# Patient Record
Sex: Female | Born: 1990 | Hispanic: Yes | Marital: Married | Smoking: Former smoker
Health system: Southern US, Community
[De-identification: ages and names within clinical notes are randomized; demographics above are authoritative.]

## PROBLEM LIST (undated history)

## (undated) DIAGNOSIS — Z8711 Personal history of peptic ulcer disease: Secondary | ICD-10-CM

## (undated) DIAGNOSIS — F319 Bipolar disorder, unspecified: Secondary | ICD-10-CM

## (undated) DIAGNOSIS — Z8719 Personal history of other diseases of the digestive system: Secondary | ICD-10-CM

## (undated) HISTORY — PX: OTHER SURGICAL HISTORY: SHX169

---

## 2018-01-22 ENCOUNTER — Emergency Department (HOSPITAL_COMMUNITY)
Admission: EM | Admit: 2018-01-22 | Discharge: 2018-01-22 | Disposition: A | Discharge status: Home / Self Care | Payer: BLUE CROSS/BLUE SHIELD | Attending: Emergency Medicine | Admitting: Emergency Medicine

## 2018-01-22 ENCOUNTER — Encounter (HOSPITAL_COMMUNITY): Payer: Self-pay | Admitting: Emergency Medicine

## 2018-01-22 ENCOUNTER — Other Ambulatory Visit: Payer: Self-pay

## 2018-01-22 ENCOUNTER — Emergency Department (HOSPITAL_COMMUNITY): Payer: BLUE CROSS/BLUE SHIELD

## 2018-01-22 DIAGNOSIS — Z87891 Personal history of nicotine dependence: Secondary | ICD-10-CM | POA: Insufficient documentation

## 2018-01-22 DIAGNOSIS — Z79899 Other long term (current) drug therapy: Secondary | ICD-10-CM | POA: Insufficient documentation

## 2018-01-22 DIAGNOSIS — R3129 Other microscopic hematuria: Secondary | ICD-10-CM | POA: Diagnosis not present

## 2018-01-22 DIAGNOSIS — R079 Chest pain, unspecified: Secondary | ICD-10-CM | POA: Diagnosis not present

## 2018-01-22 DIAGNOSIS — R1013 Epigastric pain: Secondary | ICD-10-CM | POA: Diagnosis not present

## 2018-01-22 HISTORY — DX: Personal history of other diseases of the digestive system: Z87.19

## 2018-01-22 HISTORY — DX: Personal history of peptic ulcer disease: Z87.11

## 2018-01-22 LAB — URINALYSIS, ROUTINE W REFLEX MICROSCOPIC
BILIRUBIN URINE: NEGATIVE
GLUCOSE, UA: NEGATIVE mg/dL
Ketones, ur: NEGATIVE mg/dL
Leukocytes, UA: NEGATIVE
NITRITE: NEGATIVE
PROTEIN: NEGATIVE mg/dL
Specific Gravity, Urine: 1.008 (ref 1.005–1.030)
pH: 7 (ref 5.0–8.0)

## 2018-01-22 LAB — CBC
HCT: 44.6 % (ref 36.0–46.0)
HEMOGLOBIN: 14.9 g/dL (ref 12.0–15.0)
MCH: 28.4 pg (ref 26.0–34.0)
MCHC: 33.4 g/dL (ref 30.0–36.0)
MCV: 85.1 fL (ref 78.0–100.0)
Platelets: 262 10*3/uL (ref 150–400)
RBC: 5.24 MIL/uL — ABNORMAL HIGH (ref 3.87–5.11)
RDW: 12 % (ref 11.5–15.5)
WBC: 8.8 10*3/uL (ref 4.0–10.5)

## 2018-01-22 LAB — COMPREHENSIVE METABOLIC PANEL
ALK PHOS: 87 U/L (ref 38–126)
ALT: 30 U/L (ref 0–44)
ANION GAP: 13 (ref 5–15)
AST: 30 U/L (ref 15–41)
Albumin: 4.2 g/dL (ref 3.5–5.0)
BILIRUBIN TOTAL: 0.8 mg/dL (ref 0.3–1.2)
BUN: 9 mg/dL (ref 6–20)
CALCIUM: 10 mg/dL (ref 8.9–10.3)
CO2: 26 mmol/L (ref 22–32)
Chloride: 102 mmol/L (ref 98–111)
Creatinine, Ser: 0.53 mg/dL (ref 0.44–1.00)
GFR calc Af Amer: >60 mL/min (ref >60)
GFR calc non Af Amer: >60 mL/min (ref >60)
Glucose, Bld: 101 mg/dL — ABNORMAL HIGH (ref 70–99)
Potassium: 3.9 mmol/L (ref 3.5–5.1)
SODIUM: 141 mmol/L (ref 135–145)
Total Protein: 7.6 g/dL (ref 6.5–8.1)

## 2018-01-22 LAB — I-STAT BETA HCG BLOOD, ED (MC, WL, AP ONLY): I-stat hCG, quantitative: <5 m[IU]/mL (ref <5)

## 2018-01-22 LAB — I-STAT TROPONIN, ED: Troponin i, poc: 0.02 ng/mL (ref 0.00–0.08)

## 2018-01-22 LAB — LIPASE, BLOOD: Lipase: 36 U/L (ref 11–51)

## 2018-01-22 MED ORDER — IPRATROPIUM-ALBUTEROL 0.5-2.5 (3) MG/3ML IN SOLN: 3.0000 mL | Freq: Once | RESPIRATORY_TRACT | Status: DC

## 2018-01-22 MED ORDER — DICYCLOMINE HCL 10 MG/ML IM SOLN
20.0000 mg | Freq: Once | INTRAMUSCULAR | Status: AC
  Administered 2018-01-22: 20 mg via INTRAMUSCULAR
  Filled 2018-01-22: qty 2

## 2018-01-22 MED ORDER — DICYCLOMINE HCL 20 MG PO TABS: 20.0000 mg | ORAL_TABLET | Freq: Two times a day (BID) | ORAL | 0 refills | Status: AC

## 2018-01-22 MED ORDER — MORPHINE SULFATE (PF) 4 MG/ML IV SOLN
4.0000 mg | Freq: Once | INTRAVENOUS | Status: AC
  Administered 2018-01-22: 4 mg via INTRAMUSCULAR
  Filled 2018-01-22: qty 1

## 2018-01-22 MED ORDER — SUCRALFATE 1 G PO TABS: 1.0000 g | ORAL_TABLET | Freq: Three times a day (TID) | ORAL | 0 refills | Status: AC

## 2018-01-22 MED ORDER — ONDANSETRON HCL 4 MG PO TABS
4.0000 mg | ORAL_TABLET | Freq: Once | ORAL | Status: AC
  Administered 2018-01-22: 4 mg via ORAL
  Filled 2018-01-22: qty 1

## 2018-01-22 MED ORDER — GI COCKTAIL ~~LOC~~
30.0000 mL | Freq: Once | ORAL | Status: AC
  Administered 2018-01-22: 30 mL via ORAL
  Filled 2018-01-22: qty 30

## 2018-01-22 MED ORDER — PREDNISONE 20 MG PO TABS: 60.0000 mg | ORAL_TABLET | Freq: Once | ORAL | Status: DC

## 2018-01-22 NOTE — ED Provider Notes (Signed)
[facility] EMERGENCY DEPARTMENT Provider Note   CSN: [id number] Arrival date & time: 01/22/18  0865     History   Chief Complaint Chief Complaint  Patient presents with  . Abdominal Pain  . Chest Pain    HPI Veronica Herrera is a 27 y.o. female.  HPI  Patient is a 27 year old female with a history of ADHD and abdominal surgical history significant for abdominoplasty presenting for upper abdominal pain chest pain.  Patient reports that she has had intermittent upper abdominal pain since 12-29-2017 when she was diagnosed with a peptic ulcer on CT scan at [facility].  Patient reports that she began having worsening pain, and her pain 2 AM.  Patient reports that it was a pressure-like sensation, and it is resolved at present.  Patient any shortness of breath, coughing, fever, chills. Patient reports she has had multiple episodes of emesis since going to bed around 11 pm last night but no hematemesis.  No melena or hematochezia.  Patient denies any urgency, frequency, or dysuria.  Patient denies any family history of early cardiac disease.  No estrogen use, immobilization, hospitalization, cancer treatment, recent surgeries, or extended travel, hemoptysis, or lower extremity edema or calf tenderness.  Patient has been taking outpatient Protonix, as well as intermittent Mylanta and viscous lidocaine per her PCP.  Patient denies regular alcohol or NSAID use.  Past Medical History:  Diagnosis Date  . History of stomach ulcers     There are no active problems to display for this patient.   Past Surgical History:  Procedure Laterality Date  . Tummy Tuck       OB History   None      Home Medications    Prior to Admission medications   Medication Sig Start Date End Date Taking? Authorizing Provider  alum & mag hydroxide-simeth (MAALOX/MYLANTA) 200-200-20 MG/5ML suspension Take 15 mLs by mouth every 3 (three) hours as needed for indigestion or heartburn.   Yes  [provider]  amphetamine-dextroamphetamine (ADDERALL) 20 MG tablet Take 20 mg by mouth 2 (two) times daily. 01/06/18  Yes [provider]  lidocaine (XYLOCAINE) 2 % solution Use as directed 15 mLs in the mouth or throat every 3 (three) hours as needed for mouth pain.  01/01/18  Yes [provider]  pantoprazole (PROTONIX) 40 MG tablet Take 40 mg by mouth daily. 12/30/17  Yes [provider]  ranitidine (ZANTAC) 150 MG tablet Take 150 mg by mouth daily. 12/30/17  Yes [provider]    Family History No family history on file.  Social History Social History   Tobacco Use  . Smoking status: Former Games developer  . Smokeless tobacco: Never Used  Substance Use Topics  . Alcohol use: Yes  . Drug use: Never     Allergies   Turmeric   Review of Systems Review of Systems  Constitutional: Negative for chills and fever.  HENT: Negative for congestion, sore throat, trouble swallowing and voice change.   Eyes: Negative for visual disturbance.  Respiratory: Positive for chest tightness. Negative for cough and shortness of breath.   Cardiovascular: Negative for chest pain, palpitations and leg swelling.  Gastrointestinal: Positive for abdominal pain, nausea and vomiting. Negative for blood in stool and diarrhea.  Genitourinary: Negative for dysuria and flank pain.  Musculoskeletal: Negative for back pain and myalgias.  Skin: Negative for rash.  Neurological: Negative for dizziness, syncope, light-headedness and headaches.     Physical Exam Updated Vital Signs BP 112/76  Pulse 86   Resp 12   Ht 5\' 2"  (1.575 m)   Wt 75.8 kg   SpO2 99%   BMI 30.54 kg/m   Physical Exam  Constitutional: She appears well-developed and well-nourished. No distress.  HENT:  Head: Normocephalic and atraumatic.  Mouth/Throat: Oropharynx is clear and moist.  Eyes: Pupils are equal, round, and reactive to light. Conjunctivae and EOM are normal.  Neck: Normal range  of motion. Neck supple.  Cardiovascular: Normal rate, regular rhythm, S1 normal and S2 normal.  No murmur heard. Pulmonary/Chest: Effort normal and breath sounds normal. She has no wheezes. She has no rales.  Abdominal: Soft. Bowel sounds are normal. She exhibits no distension. There is no guarding.  Mild tenderness to palpation of the periumbilical to epigastric region.  No guarding or rebound.  Abdomen not peritonitic.  Negative Murphy sign.  Musculoskeletal: Normal range of motion. She exhibits no edema or deformity.  Lymphadenopathy:    She has no cervical adenopathy.  Neurological: She is alert.  Cranial nerves grossly intact. Patient moves extremities symmetrically and with good coordination.  Skin: Skin is warm and dry. No rash noted. No erythema.  Psychiatric: She has a normal mood and affect. Her behavior is normal. Judgment and thought content normal.  Nursing note and vitals reviewed.    ED Treatments / Results  Labs (all labs ordered are listed, but only abnormal results are displayed) Labs Reviewed  COMPREHENSIVE METABOLIC PANEL - Abnormal; Notable for the following components:      Result Value   Glucose, Bld 101 (*)    All other components within normal limits  CBC - Abnormal; Notable for the following components:   RBC 5.24 (*)    All other components within normal limits  URINALYSIS, ROUTINE W REFLEX MICROSCOPIC - Abnormal; Notable for the following components:   Color, Urine STRAW (*)    Hgb urine dipstick SMALL (*)    Bacteria, UA RARE (*)    All other components within normal limits  LIPASE, BLOOD  I-STAT BETA HCG BLOOD, ED ([facility], [facility], [facility] ONLY)  I-STAT TROPONIN, ED    EKG EKG Interpretation  Date/Time:  Thursday January 22 2018 04:43:39 EDT Ventricular Rate:  96 PR Interval:  138 QRS Duration: 66 QT Interval:  334 QTC Calculation: 421 R Axis:   48 Text Interpretation:  Sinus rhythm with sinus arrhythmia with occasional Premature ventricular complexes  Otherwise normal ECG No old tracing to compare Confirmed by Dione BoozeGlick, David ([id number]) on 01/22/2018 7:02:48 AM   Radiology Dg Chest 2 View  Result Date: 01/22/2018 CLINICAL DATA:  Acute onset of generalized abdominal pain and chest pressure. EXAM: CHEST - 2 VIEW COMPARISON:  None. FINDINGS: The lungs are well-aerated and clear. There is no evidence of focal opacification, pleural effusion or pneumothorax. The heart is normal in size; the mediastinal contour is within normal limits. No acute osseous abnormalities are seen. IMPRESSION: No acute cardiopulmonary process seen. Electronically Signed   By: Roanna RaiderJeffery  Chang M.D.   On: 01/22/2018 05:49    Procedures Procedures (including critical care time)  Medications Ordered in ED Medications - No data to display   Initial Impression / Assessment and Plan / ED Course  I have reviewed the triage vital signs and the nursing notes.  Pertinent labs & imaging results that were available during my care of the patient were reviewed by me and considered in my medical decision making (see chart for details).  Clinical Course as of Jan 23 852  Thu Jan 22, 2018  0629 Spoke with [facility] radiology secretary who pulled up the image from 12-29-2017 in PACS of pt's CT abdomen/pelvis which stated in the impression " extraluminal fluid in the abdomen abdomen surrounding the distal stomach and duodenal bulb concerning for underlying peptic ulcer". C/w patient's symptoms at this time.    [AM]  0702 Hgb urine dipstick(!): SMALL [AM]  0739 Reassessed patient.  Patient reports minimal improvement in her symptoms. Repeat abdominal exam continues to be soft and nonperitonitic.   [AM]  0852 Reassessed.  No pain at present.   [AM]    Clinical Course User Index [AM] Elisha Ponder, PA-C    Patient is nontoxic-appearing,, and with benign abdomen.  Do not suspect perforated viscus.  Differential diagnosis includes peptic ulcer disease versus gastritis, cholecystitis, ACS,  PE, pneumonia, pleurisy, pneumothorax.  Doubt ACS, due to age, lack of major risk factors, and atypical story, in addition to lack of chest pain at present.  Nonischemic EKG and initial troponin negative. Do not feel delta troponin is warranted. No tenderness under costal margin suggestive of cholecystitis.  Chest x-ray lung exam are clear.  Most suspicious of persistent ulcer/gastritis at this time given patient's history and verified findings on CT scan.  Patient has no peritonitis on repeat abdominal exams that would warrant CT scan.  Work-up is unremarkable for new acute pathology with negative troponin, normal and nonischemic EKG, reviewed by me, normal chest x-ray, reviewed by me, no leukocytosis, and normal kidney, liver function, and electrolytes.  I did discuss with the patient that she has small amount of blood in her urine, and this needs to be reassessed by her primary doctor.  Return precautions were discussed including worsening abdominal pain or intractable nausea or vomiting.  Patient to follow-up with both primary care, as well as previously scheduled GI appointment.  Patient is in understanding and agrees with the plan of care.  Final Clinical Impressions(s) / ED Diagnoses   Final diagnoses:  Epigastric pain  Microscopic hematuria    ED Discharge Orders         Ordered    sucralfate (CARAFATE) 1 g tablet  3 times daily with meals & bedtime     01/22/18 0855    dicyclomine (BENTYL) 20 MG tablet  2 times daily     01/22/18 0855           Elisha Ponder, PA-C 01/22/18 1610    Dione Booze, MD 01/22/18 2238

## 2018-01-22 NOTE — Discharge Instructions (Addendum)
Please see the information and instructions below regarding your visit.  Your diagnoses today include:  1. Epigastric pain   2. Microscopic hematuria     Tests performed today include: See side panel of your discharge paperwork for testing performed today. Vital signs are listed at the bottom of these instructions.   Your blood work is very reassuring today.  In addition, your chest x-ray and EKG look normal.  Medications prescribed:    Take any prescribed medications only as prescribed, and any over the counter medications only as directed on the packaging.  Please start taking a medicine called Carafate or sucralfate.  This can be taken up to 4 times a day, with meals and before bedtime.  Please do not take your proton pump inhibitor (Protonix, Nexium, Prilosec) within 30 minutes of taking Carafate.  This medication coats the lining of the stomach to protect it from irritation and ulcers.  Please start taking Bentyl.  This is a medication that helps with spasming of the GI tract.  Home care instructions:  Please follow any educational materials contained in this packet.  Please review the alterations in food choices recommended.  Follow-up instructions: Please follow-up with your primary care provider in 5-7 days for further evaluation of your symptoms if they are not completely improved.  Please discuss with primary doctor whether they may be able to do stool antigen testing for H. pylori.  You also have a small amount of blood in your urine.  Please have a repeat urinalysis within 1 month to ensure this clears.  Please follow up with your GI doctor as soon as possible.  Return instructions:  Please return to the Emergency Department if you experience worsening symptoms. Return the emergency department immediately for any worsening belly pain, nausea or vomiting that prevents you keeping anything down. Please return if you have any other emergent concerns.  Additional  Information:   Your vital signs today were: BP 106/90    Pulse 87    Temp 98.3 F (36.8 C) (Oral)    Resp 15    Ht 5\' 2"  (1.575 m)    Wt 75.8 kg    SpO2 99%    BMI 30.54 kg/m  If your blood pressure (BP) was elevated on multiple readings during this visit above 130 for the top number or above 80 for the bottom number, please have this repeated by your primary care provider within one month. --------------  Thank you for allowing us to participate in your care today.

## 2018-01-22 NOTE — ED Notes (Signed)
Pt discharged from ED; instructions provided; Pt encouraged to return to ED if symptoms worsen and to f/u with PCP; Pt verbalized understanding of all instructions 

## 2018-01-22 NOTE — ED Triage Notes (Signed)
Pt reports being diagnosed with a stomach ulcer on 12/29/17. Pt reports that her adb started to hurt at 2300 hrs last night and then her chest started to hurt at 0230 this date. Pt reports that her chest pain was pressure in nature and the abd pain is a constant pain.

## 2020-02-03 IMAGING — CR DG CHEST 2V
2 series · 2 of 2 positions shown · non-contrast
Comparison: None.

CLINICAL DATA: Acute onset of generalized abdominal pain and chest
pressure.

EXAM:
CHEST - 2 VIEW

[chest pa]
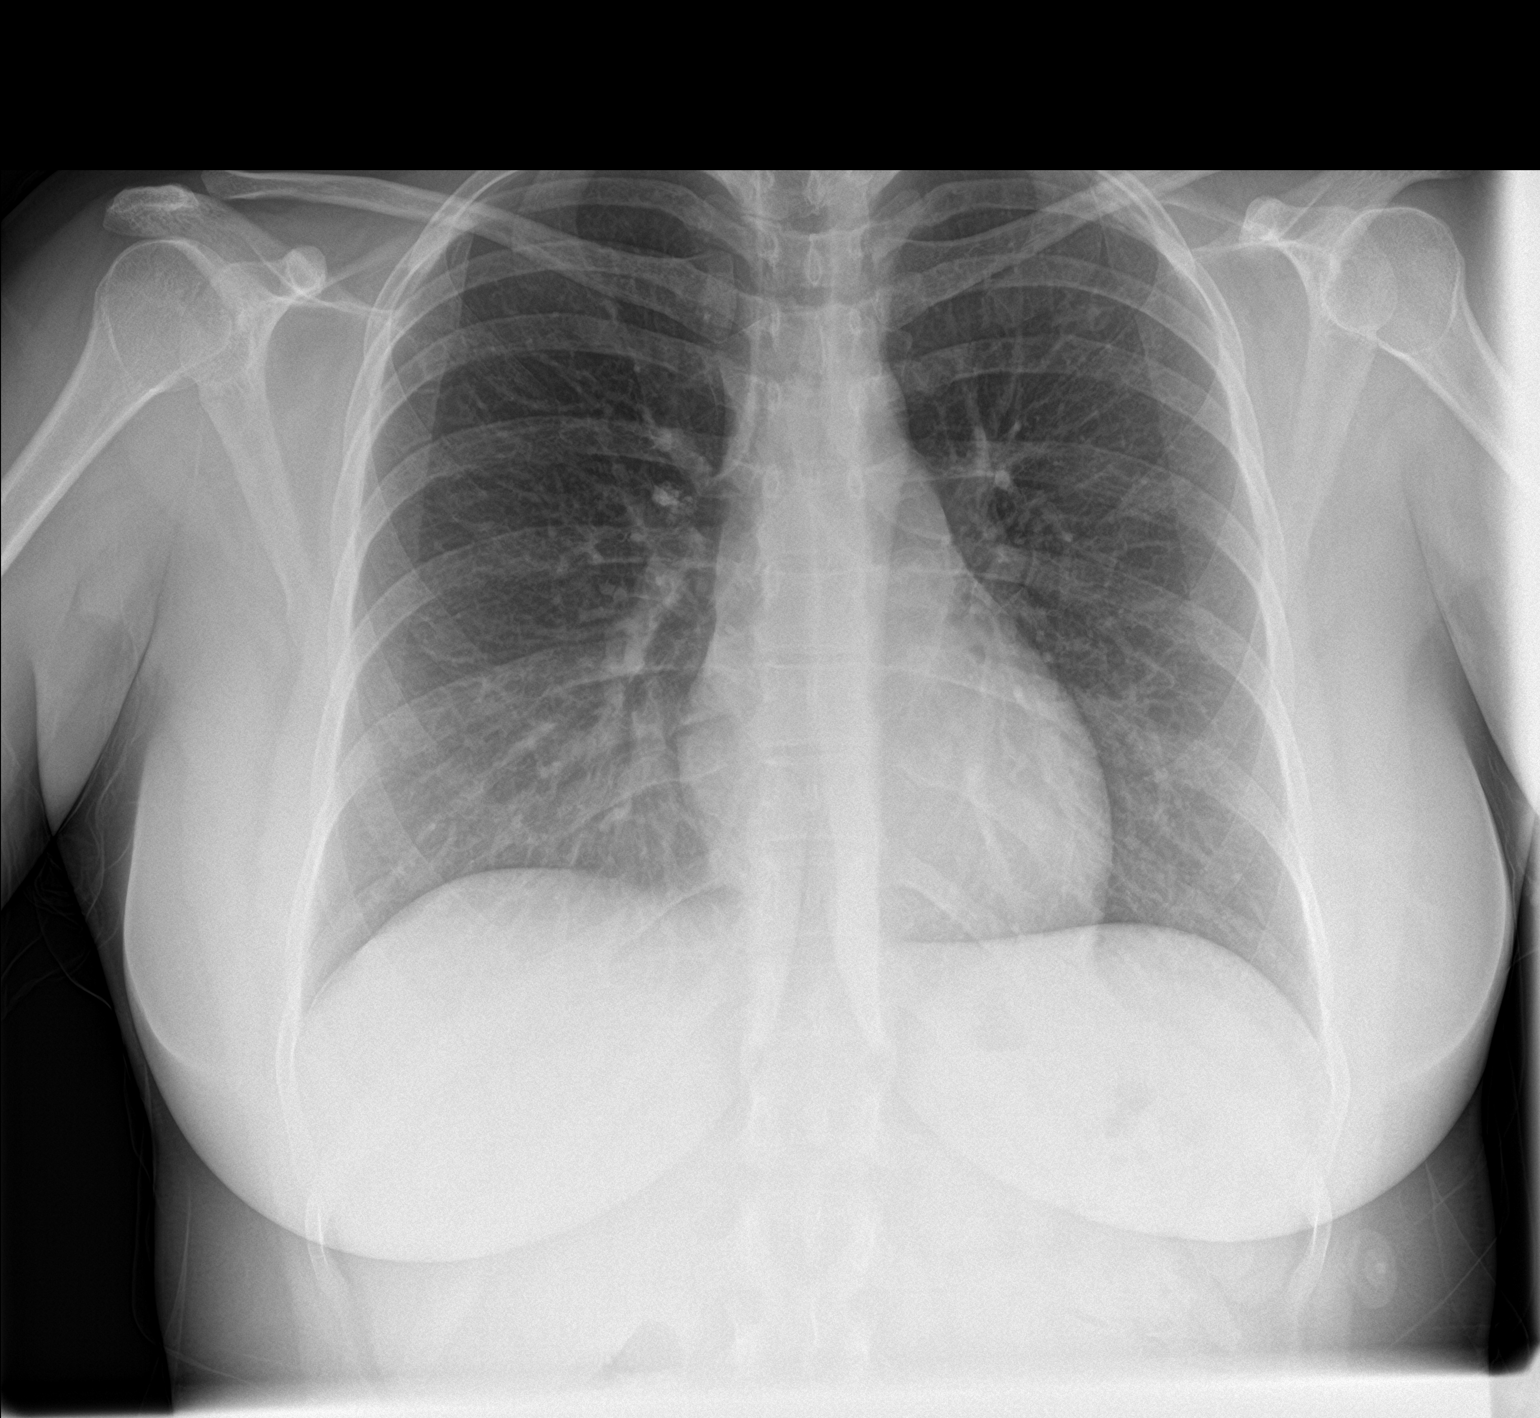

[chest lat]
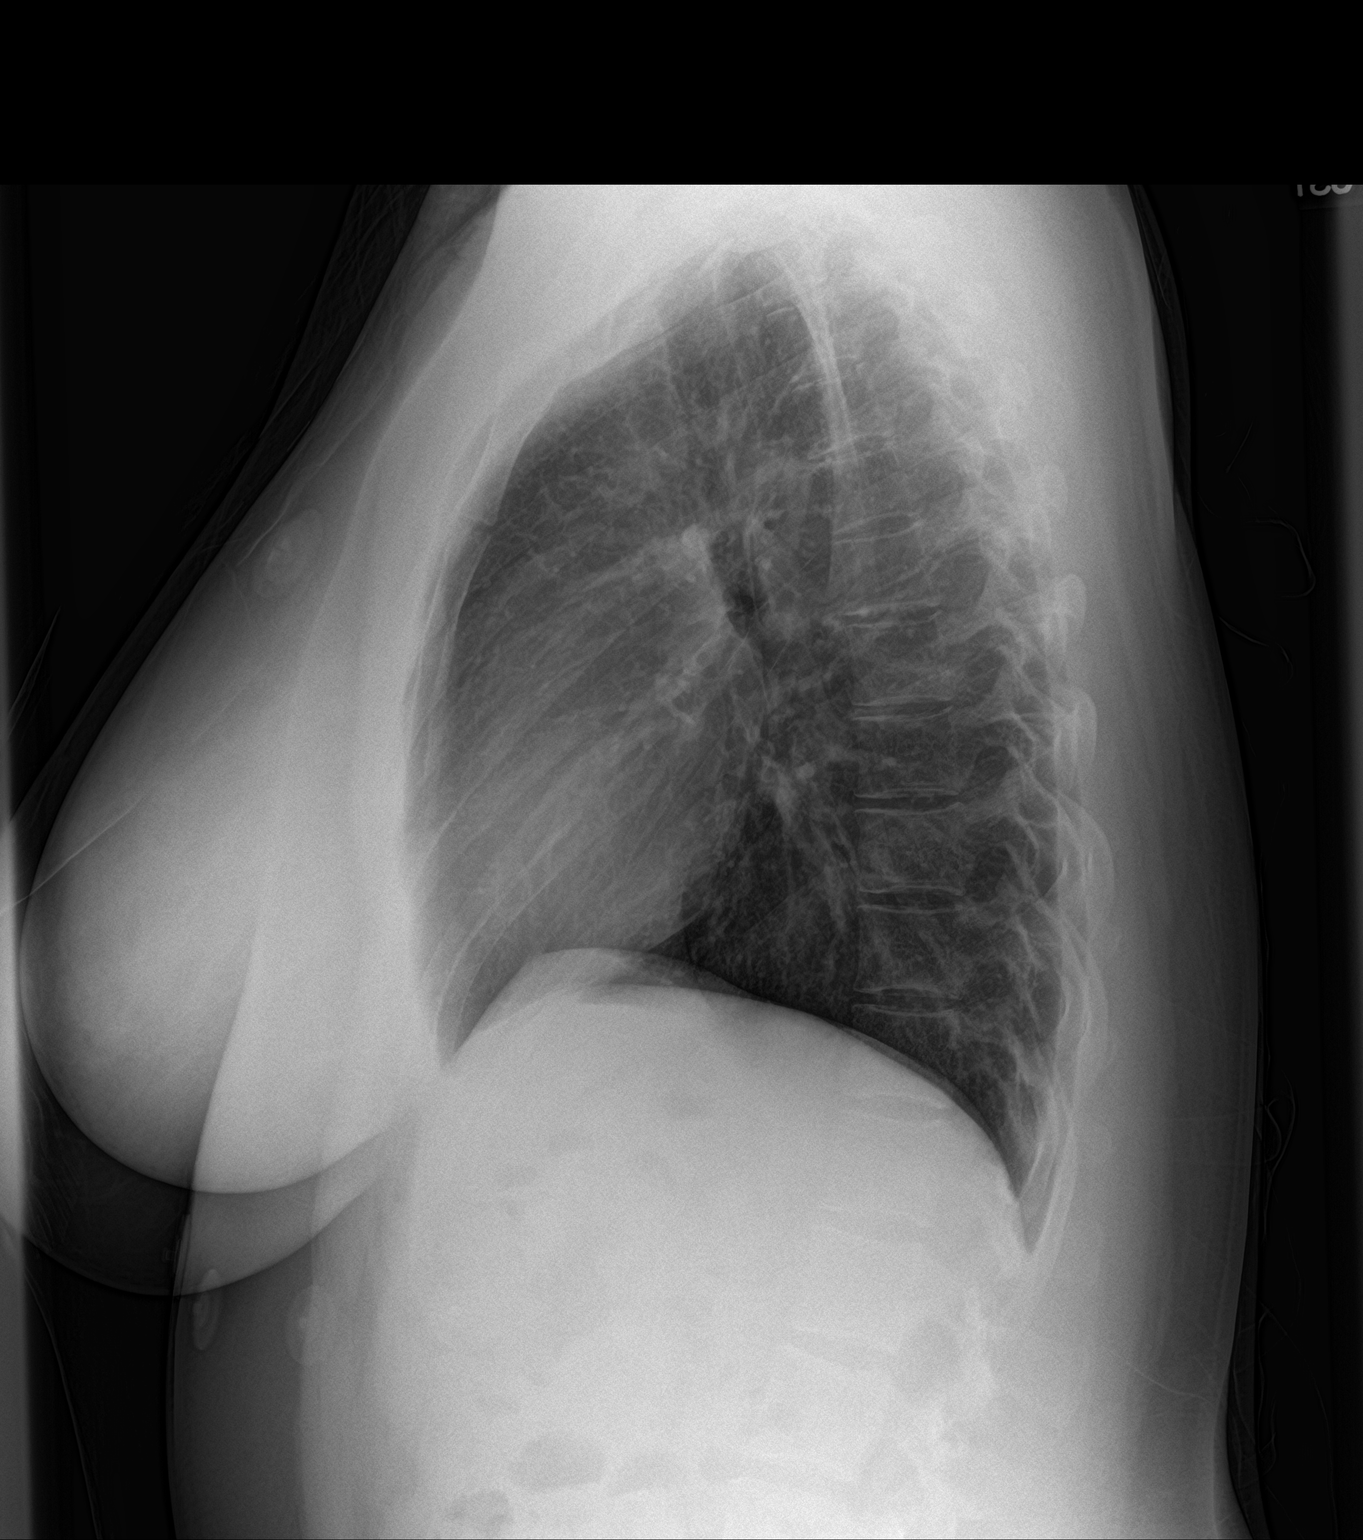

[2 of 2 positions shown; findings below may reference images not displayed]

FINDINGS: The lungs are well-aerated and clear. There is no evidence of focal
opacification, pleural effusion or pneumothorax.

The heart is normal in size; the mediastinal contour is within
normal limits. No acute osseous abnormalities are seen.
IMPRESSION: No acute cardiopulmonary process seen.

## 2022-02-11 ENCOUNTER — Emergency Department (HOSPITAL_COMMUNITY): Payer: BC Managed Care – PPO

## 2022-02-11 ENCOUNTER — Encounter (HOSPITAL_COMMUNITY): Payer: Self-pay

## 2022-02-11 ENCOUNTER — Emergency Department (HOSPITAL_COMMUNITY)
Admission: EM | Admit: 2022-02-11 | Discharge: 2022-02-12 | Disposition: A | Discharge status: Home / Self Care | Payer: BC Managed Care – PPO | Attending: Emergency Medicine | Admitting: Emergency Medicine

## 2022-02-11 ENCOUNTER — Other Ambulatory Visit: Payer: Self-pay

## 2022-02-11 DIAGNOSIS — R0602 Shortness of breath: Secondary | ICD-10-CM | POA: Insufficient documentation

## 2022-02-11 DIAGNOSIS — R079 Chest pain, unspecified: Secondary | ICD-10-CM | POA: Diagnosis not present

## 2022-02-11 DIAGNOSIS — R112 Nausea with vomiting, unspecified: Secondary | ICD-10-CM | POA: Diagnosis not present

## 2022-02-11 DIAGNOSIS — R002 Palpitations: Secondary | ICD-10-CM | POA: Insufficient documentation

## 2022-02-11 DIAGNOSIS — Z859 Personal history of malignant neoplasm, unspecified: Secondary | ICD-10-CM | POA: Insufficient documentation

## 2022-02-11 LAB — CBC WITH DIFFERENTIAL/PLATELET
Abs Immature Granulocytes: 0.04 10*3/uL (ref 0.00–0.07)
Basophils Absolute: 0.1 10*3/uL (ref 0.0–0.1)
Basophils Relative: 1 %
Eosinophils Absolute: 0.1 10*3/uL (ref 0.0–0.5)
Eosinophils Relative: 1 %
HCT: 41.1 % (ref 36.0–46.0)
Hemoglobin: 14.4 g/dL (ref 12.0–15.0)
Immature Granulocytes: 1 %
Lymphocytes Relative: 21 %
Lymphs Abs: 1.7 10*3/uL (ref 0.7–4.0)
MCH: 29.3 pg (ref 26.0–34.0)
MCHC: 35 g/dL (ref 30.0–36.0)
MCV: 83.5 fL (ref 80.0–100.0)
Monocytes Absolute: 0.4 10*3/uL (ref 0.1–1.0)
Monocytes Relative: 5 %
Neutro Abs: 5.6 10*3/uL (ref 1.7–7.7)
Neutrophils Relative %: 71 %
Platelets: 297 10*3/uL (ref 150–400)
RBC: 4.92 MIL/uL (ref 3.87–5.11)
RDW: 12.5 % (ref 11.5–15.5)
WBC: 7.9 10*3/uL (ref 4.0–10.5)
nRBC: 0 % (ref 0.0–0.2)

## 2022-02-11 LAB — I-STAT BETA HCG BLOOD, ED (MC, WL, AP ONLY): I-stat hCG, quantitative: <5 m[IU]/mL (ref <5)

## 2022-02-11 LAB — BASIC METABOLIC PANEL
Anion gap: 7 (ref 5–15)
BUN: 5 mg/dL — ABNORMAL LOW (ref 6–20)
CO2: 26 mmol/L (ref 22–32)
Calcium: 9.5 mg/dL (ref 8.9–10.3)
Chloride: 105 mmol/L (ref 98–111)
Creatinine, Ser: 0.77 mg/dL (ref 0.44–1.00)
GFR, Estimated: >60 mL/min (ref >60)
Glucose, Bld: 96 mg/dL (ref 70–99)
Potassium: 4.2 mmol/L (ref 3.5–5.1)
Sodium: 138 mmol/L (ref 135–145)

## 2022-02-11 LAB — TROPONIN I (HIGH SENSITIVITY): Troponin I (High Sensitivity): <2 ng/L (ref <18)

## 2022-02-11 NOTE — ED Triage Notes (Signed)
Pt was at work today when she became SOB and had central chest pressure. Pain  did not radiate. Pt states she does not know if it was an anxiety attack. Pt does have history on anxiety. Chest pain has resolved but SOB continues. Pt denies being sick recently.

## 2022-02-11 NOTE — ED Provider Triage Note (Signed)
Emergency Medicine Provider Triage Evaluation Note  Veronica Herrera , a 31 y.o. female  was evaluated in triage.  Pt complains of shortness of breath and central chest pressure while at work today. History of anxiety, supposed to take klonopin, however, doesn't like the way it makes her feel. Chest pain resolved at this time.   Review of Systems  Positive:  Negative:   Physical Exam  BP (!) 145/106   Pulse 98   Temp 99 F (37.2 C) (Oral)   Resp 16   Ht 5\' 2"  (1.575 m)   Wt 75.8 kg   SpO2 100%   BMI 30.56 kg/m  Gen:   Awake, no distress   Resp:  Normal effort  MSK:   Moves extremities without difficulty  Other:    Medical Decision Making  Medically screening exam initiated at 8:58 PM.  Appropriate orders placed.  Jazarah Capili was informed that the remainder of the evaluation will be completed by another provider, this initial triage assessment does not replace that evaluation, and the importance of remaining in the ED until their evaluation is complete.     Maxmilian Trostel A, PA-C 02/11/22 2059

## 2022-02-12 ENCOUNTER — Encounter (HOSPITAL_COMMUNITY): Payer: Self-pay | Admitting: Student

## 2022-02-12 LAB — HEPATIC FUNCTION PANEL
ALT: 22 U/L (ref 0–44)
AST: 19 U/L (ref 15–41)
Albumin: 4.1 g/dL (ref 3.5–5.0)
Alkaline Phosphatase: 72 U/L (ref 38–126)
Bilirubin, Direct: 0.1 mg/dL (ref 0.0–0.2)
Indirect Bilirubin: 0.4 mg/dL (ref 0.3–0.9)
Total Bilirubin: 0.5 mg/dL (ref 0.3–1.2)
Total Protein: 7.3 g/dL (ref 6.5–8.1)

## 2022-02-12 LAB — TROPONIN I (HIGH SENSITIVITY): Troponin I (High Sensitivity): 4 ng/L (ref <18)

## 2022-02-12 LAB — LIPASE, BLOOD: Lipase: 33 U/L (ref 11–51)

## 2022-02-12 LAB — TSH: TSH: 2.026 u[IU]/mL (ref 0.350–4.500)

## 2022-02-12 LAB — D-DIMER, QUANTITATIVE: D-Dimer, Quant: 0.4 ug/mL-FEU (ref 0.00–0.50)

## 2022-02-12 MED ORDER — HYDROXYZINE HCL 10 MG PO TABS
10.0000 mg | ORAL_TABLET | Freq: Once | ORAL | Status: AC
  Administered 2022-02-12: 10 mg via ORAL
  Filled 2022-02-12: qty 1

## 2022-02-12 MED ORDER — SODIUM CHLORIDE 0.9 % IV BOLUS
1000.0000 mL | Freq: Once | INTRAVENOUS | Status: AC
  Administered 2022-02-12: 1000 mL via INTRAVENOUS

## 2022-02-12 NOTE — Discharge Instructions (Signed)
You were seen in the emergency department today for chest pain, trouble breathing, and palpitations. Your work-up in the emergency department has been overall reassuring.  Your lab are overall reassuring, they are detailed below.  Your EKG and the enzyme we use to check your heart did not show an acute heart attack at this time. Your chest x-ray was normal.  Your cardiac monitoring did show a couple of PVCs.  We would like you to follow up closely with your primary care provider and/or the cardiologist provided in your discharge instructions within 1-3 days.  Please discuss your anxiety medication with the prescribing provider for possible adjustments.  Return to the ER immediately should you experience any new or worsening symptoms including but not limited to return of pain, worsened pain, vomiting, shortness of breath, dizziness, lightheadedness, passing out, or any other concerns that you may have.     Results for orders placed or performed during the hospital encounter of 02/11/22  Basic metabolic panel  Result Value Ref Range   Sodium 138 135 - 145 mmol/L   Potassium 4.2 3.5 - 5.1 mmol/L   Chloride 105 98 - 111 mmol/L   CO2 26 22 - 32 mmol/L   Glucose, Bld 96 70 - 99 mg/dL   BUN 5 (L) 6 - 20 mg/dL   Creatinine, Ser 2.53 0.44 - 1.00 mg/dL   Calcium 9.5 8.9 - 66.4 mg/dL   GFR, Estimated >40 >34 mL/min   Anion gap 7 5 - 15  CBC with Differential  Result Value Ref Range   WBC 7.9 4.0 - 10.5 K/uL   RBC 4.92 3.87 - 5.11 MIL/uL   Hemoglobin 14.4 12.0 - 15.0 g/dL   HCT 74.2 59.5 - 63.8 %   MCV 83.5 80.0 - 100.0 fL   MCH 29.3 26.0 - 34.0 pg   MCHC 35.0 30.0 - 36.0 g/dL   RDW 75.6 43.3 - 29.5 %   Platelets 297 150 - 400 K/uL   nRBC 0.0 0.0 - 0.2 %   Neutrophils Relative % 71 %   Neutro Abs 5.6 1.7 - 7.7 K/uL   Lymphocytes Relative 21 %   Lymphs Abs 1.7 0.7 - 4.0 K/uL   Monocytes Relative 5 %   Monocytes Absolute 0.4 0.1 - 1.0 K/uL   Eosinophils Relative 1 %   Eosinophils Absolute 0.1  0.0 - 0.5 K/uL   Basophils Relative 1 %   Basophils Absolute 0.1 0.0 - 0.1 K/uL   Immature Granulocytes 1 %   Abs Immature Granulocytes 0.04 0.00 - 0.07 K/uL  Lipase, blood  Result Value Ref Range   Lipase 33 11 - 51 U/L  Hepatic function panel  Result Value Ref Range   Total Protein 7.3 6.5 - 8.1 g/dL   Albumin 4.1 3.5 - 5.0 g/dL   AST 19 15 - 41 U/L   ALT 22 0 - 44 U/L   Alkaline Phosphatase 72 38 - 126 U/L   Total Bilirubin 0.5 0.3 - 1.2 mg/dL   Bilirubin, Direct 0.1 0.0 - 0.2 mg/dL   Indirect Bilirubin 0.4 0.3 - 0.9 mg/dL  D-dimer, quantitative  Result Value Ref Range   D-Dimer, Quant 0.40 0.00 - 0.50 ug/mL-FEU  TSH  Result Value Ref Range   TSH 2.026 0.350 - 4.500 uIU/mL  I-Stat Beta hCG blood, ED ([facility], [facility], [facility] only)  Result Value Ref Range   I-stat hCG, quantitative <5.0 <5 mIU/mL   Comment 3  Troponin I (High Sensitivity)  Result Value Ref Range   Troponin I (High Sensitivity) <2 <18 ng/L  Troponin I (High Sensitivity)  Result Value Ref Range   Troponin I (High Sensitivity) 4 <18 ng/L   DG Chest 2 View  Result Date: 02/11/2022 CLINICAL DATA:  Shortness of breath EXAM: CHEST - 2 VIEW COMPARISON:  01/22/2018 FINDINGS: Lungs are clear.  No pleural effusion or pneumothorax. The heart is normal in size. Visualized osseous structures are within normal limits. IMPRESSION: Normal chest radiographs. Electronically Signed   By: Charline Bills M.D.   On: 02/11/2022 21:08

## 2022-02-12 NOTE — ED Notes (Signed)
RN reviewed discharge instructions with pt. Pt verbalized understanding and had no further questions. VSS upon discharge.  

## 2022-02-12 NOTE — ED Provider Notes (Signed)
[facility] EMERGENCY DEPARTMENT Provider Note   CSN: [id number] Arrival date & time: 02/11/22  2012     History  Chief Complaint  Patient presents with   Shortness of Breath   Chest Pain    Veronica Herrera is a 31 y.o. female with a history of PUD who presents to the emergency department complains of chest discomfort that started around 1900.  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  She describes chest discomfort as a tightness with associated shortness of breath and palpitations described as her heart pounding with some nausea and one episode of emesis.  The chest tightness and palpitations have persisted, no alleviating or aggravating factors throughout the night, no history of similar.  Denies lightheadedness, dizziness, syncope,  leg pain/swelling, hemoptysis, recent surgery/trauma, recent long travel, hormone use, personal hx of cancer, or hx of DVT/PE.  Denies drug or alcohol use.  She does take Adderall and took this this evening, however she has taken this intermittently for a long time and has not had any recent dose changes.    HPI     Home Medications Prior to Admission medications   Medication Sig Start Date End Date Taking? Authorizing Provider  alum & mag hydroxide-simeth (MAALOX/MYLANTA) 200-200-20 MG/5ML suspension Take 15 mLs by mouth every 3 (three) hours as needed for indigestion or heartburn.    [provider]  amphetamine-dextroamphetamine (ADDERALL) 20 MG tablet Take 20 mg by mouth 2 (two) times daily. 01/06/18   [provider]  dicyclomine (BENTYL) 20 MG tablet Take 1 tablet (20 mg total) by mouth 2 (two) times daily for 14 days. 01/22/18 02/05/18  Aviva Kluver B, PA-C  lidocaine (XYLOCAINE) 2 % solution Use as directed 15 mLs in the mouth or throat every 3 (three) hours as needed  for mouth pain.  01/01/18   [provider]  pantoprazole (PROTONIX) 40 MG tablet Take 40 mg by mouth daily. 12/30/17   [provider]  ranitidine (ZANTAC) 150 MG tablet Take 150 mg by mouth daily. 12/30/17   [provider]  sucralfate (CARAFATE) 1 g tablet Take 1 tablet (1 g total) by mouth 4 (four) times daily -  with meals and at bedtime for 14 days. 01/22/18 02/05/18  Aviva Kluver B, PA-C      Allergies    Turmeric    Review of Systems   Review of Systems  Constitutional:  Negative for fever.  Respiratory:  Positive for shortness of breath. Negative for cough.   Cardiovascular:  Positive for chest pain and palpitations. Negative for leg swelling.  Gastrointestinal:  Positive for nausea and vomiting. Negative for diarrhea.  Neurological:  Negative for dizziness, syncope and weakness.  All other systems reviewed and are negative.   Physical Exam Updated Vital Signs BP 138/89   Pulse 98   Temp 99.2 F (37.3 C)   Resp 17   Ht 5\' 2"  (1.575 m)   Wt 75.8 kg   SpO2 98%   BMI 30.56 kg/m  Physical Exam Vitals and nursing note reviewed.  Constitutional:      General: She is not in acute distress.    Appearance: She is well-developed. She is not toxic-appearing.  HENT:     Head: Normocephalic and atraumatic.  Eyes:     General:        Right  eye: No discharge.        Left eye: No discharge.     Conjunctiva/sclera: Conjunctivae normal.  Cardiovascular:     Rate and Rhythm: Normal rate and regular rhythm.     Pulses:          Radial pulses are 2+ on the right side and 2+ on the left side.  Pulmonary:     Effort: No respiratory distress.     Breath sounds: Normal breath sounds. No wheezing or rales.  Abdominal:     General: There is no distension.     Palpations: Abdomen is soft.     Tenderness: There is abdominal tenderness (mild) in the epigastric area. There is no guarding or rebound. Negative signs include Murphy's sign.  Musculoskeletal:      Cervical back: Neck supple.     Right lower leg: No tenderness. No edema.     Left lower leg: No tenderness. No edema.  Skin:    General: Skin is warm and dry.  Neurological:     Mental Status: She is alert.     Comments: Clear speech.   Psychiatric:        Behavior: Behavior normal.     ED Results / Procedures / Treatments   Labs (all labs ordered are listed, but only abnormal results are displayed) Labs Reviewed  BASIC METABOLIC PANEL - Abnormal; Notable for the following components:      Result Value   BUN 5 (*)    All other components within normal limits  CBC WITH DIFFERENTIAL/PLATELET  D-DIMER, QUANTITATIVE  LIPASE, BLOOD  HEPATIC FUNCTION PANEL  TSH  I-STAT BETA HCG BLOOD, ED ([facility], [facility], [facility] ONLY)  TROPONIN I (HIGH SENSITIVITY)  TROPONIN I (HIGH SENSITIVITY)    EKG EKG Interpretation  Date/Time:  Monday February 11 2022 20:28:44 EDT Ventricular Rate:  102 PR Interval:  136 QRS Duration: 66 QT Interval:  318 QTC Calculation: 414 R Axis:   14 Text Interpretation: Sinus tachycardia Low voltage QRS Borderline ECG When compared with ECG of 22-Jan-2018 04:43, present are no longer present Confirmed by Dione Booze ([id number]) on 02/12/2022 3:49:06 AM  Radiology DG Chest 2 View  Result Date: 02/11/2022 CLINICAL DATA:  Shortness of breath EXAM: CHEST - 2 VIEW COMPARISON:  01/22/2018 FINDINGS: Lungs are clear.  No pleural effusion or pneumothorax. The heart is normal in size. Visualized osseous structures are within normal limits. IMPRESSION: Normal chest radiographs. Electronically Signed   By: Charline Bills M.D.   On: 02/11/2022 21:08    Procedures Procedures    Medications Ordered in ED Medications  sodium chloride 0.9 % bolus 1,000 mL (1,000 mLs Intravenous New Bag/Given 02/12/22 0375)    ED Course/ Medical Decision Making/ A&P                           Medical Decision Making Amount and/or Complexity of Data Reviewed Labs: ordered.   Patient presents to the  emergency department with chest pain. Patient nontoxic appearing, in no apparent distress, vitals without significant abnormality, BP elevated some, low suspicion for hypertensive emergency.  DDX including but not limited to: ACS, pulmonary embolism, dissection, pneumothorax, pneumonia, arrhythmia, severe anemia, MSK, GERD, anxiety, abdominal process.   Additional history obtained:  Chart & nursing note reviewed.   EKG: No STEMI  Lab Tests:  I reviewed & interpreted labs including:  CBC, BMP, hepatic function panel, lipase, troponins, D-dimer, TSH and pregnancy test: Grossly unremarkable.  No critical anemia, electrolyte derangement, or thyroid dysfunction.  D-dimer is normal.  No significant delta troponin elevation.  Imaging Studies ordered:  I viewed the following imaging, agree with radiologist impression:  Chest x-ray: Normal chest radiographs.  ED Course:   EKG without obvious acute ischemia, no significant troponin elevation, low suspicion for ACS. Patient is low risk wells, for negative, doubt pulmonary embolism. Pain is not a tearing sensation, symmetric pulses, no widening of mediastinum on CXR, doubt dissection.  Labs reassuring.  Repeat abdominal exam remains soft peritoneal signs, low suspicion for acute surgical abdomen.  Patient did have some increased anxiety in the department, given Atarax for this that she does not like the way that her Klonopin at home makes her feel.  However she has had Atarax before and does not seem to help her much therefore prescription deferred.  I reviewed patient's cardiac monitor, she did have a couple of PVCs, no significant arrhythmias.  Based on patient's chief complaint, I considered admission might be necessary, however after reassuring ED workup feel patient is reasonable for discharge.   I discussed results, treatment plan, need for follow-up, and return precautions with the patient. Provided opportunity for questions, patient confirmed  understanding and is in agreement with plan.   Portions of this note were generated with Scientist, clinical (histocompatibility and immunogenetics). Dictation errors may occur despite best attempts at proofreading.  Final Clinical Impression(s) / ED Diagnoses Final diagnoses:  Chest pain, unspecified type  Palpitations    Rx / DC Orders ED Discharge Orders          Ordered    Ambulatory referral to Cardiology        02/12/22 0633              Cherly Anderson, PA-C 02/12/22 9518    Shon Baton, MD 02/16/22 [phone]

## 2022-04-08 ENCOUNTER — Other Ambulatory Visit: Payer: Self-pay

## 2022-04-08 ENCOUNTER — Emergency Department (HOSPITAL_COMMUNITY)
Admission: EM | Admit: 2022-04-08 | Discharge: 2022-04-08 | Disposition: A | Discharge status: Home / Self Care | Payer: BC Managed Care – PPO | Attending: Emergency Medicine | Admitting: Emergency Medicine

## 2022-04-08 ENCOUNTER — Encounter (HOSPITAL_COMMUNITY): Payer: Self-pay

## 2022-04-08 ENCOUNTER — Emergency Department (HOSPITAL_COMMUNITY): Payer: BC Managed Care – PPO

## 2022-04-08 DIAGNOSIS — M25572 Pain in left ankle and joints of left foot: Secondary | ICD-10-CM | POA: Insufficient documentation

## 2022-04-08 DIAGNOSIS — M25562 Pain in left knee: Secondary | ICD-10-CM | POA: Insufficient documentation

## 2022-04-08 DIAGNOSIS — W010XXA Fall on same level from slipping, tripping and stumbling without subsequent striking against object, initial encounter: Secondary | ICD-10-CM | POA: Insufficient documentation

## 2022-04-08 DIAGNOSIS — M79672 Pain in left foot: Secondary | ICD-10-CM | POA: Insufficient documentation

## 2022-04-08 DIAGNOSIS — W19XXXA Unspecified fall, initial encounter: Secondary | ICD-10-CM

## 2022-04-08 MED ORDER — OXYCODONE-ACETAMINOPHEN 5-325 MG PO TABS
1.0000 | ORAL_TABLET | ORAL | Status: DC | PRN
  Administered 2022-04-08: 1 via ORAL
  Filled 2022-04-08: qty 1

## 2022-04-08 NOTE — ED Provider Notes (Signed)
[facility] EMERGENCY DEPARTMENT Provider Note   CSN: [id number] Arrival date & time: 04/08/22  5188     History  Chief Complaint  Patient presents with   Marletta Lor    Veronica Herrera is a 31 y.o. female with history of stomach ulcers.  Patient presents to the ED for evaluation of fall.  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  Patient reports that there was a white spot on the floor this morning at work causing her to slip and fall onto her left knee and twisting her left ankle.  The patient is now complaining of left knee pain, left ankle pain and pain to the dorsum of her left foot.  Patient denies hitting her head, losing consciousness.  The patient denies any numbness or tingling in her left leg.   Fall       Home Medications Prior to Admission medications   Medication Sig Start Date End Date Taking? Authorizing Provider  alum & mag hydroxide-simeth (MAALOX/MYLANTA) 200-200-20 MG/5ML suspension Take 15 mLs by mouth every 3 (three) hours as needed for indigestion or heartburn.    [provider]  amphetamine-dextroamphetamine (ADDERALL) 20 MG tablet Take 20 mg by mouth 2 (two) times daily. 01/06/18   [provider]  dicyclomine (BENTYL) 20 MG tablet Take 1 tablet (20 mg total) by mouth 2 (two) times daily for 14 days. 01/22/18 02/05/18  Aviva Kluver B, PA-C  lidocaine (XYLOCAINE) 2 % solution Use as directed 15 mLs in the mouth or throat every 3 (three) hours as needed for mouth pain.  01/01/18   [provider]  pantoprazole (PROTONIX) 40 MG tablet Take 40 mg by mouth daily. 12/30/17   [provider]  ranitidine (ZANTAC) 150 MG tablet Take 150 mg by mouth daily. 12/30/17   [provider]  sucralfate (CARAFATE) 1 g tablet Take 1 tablet (1 g total) by mouth 4 (four) times daily -  with meals and at bedtime for 14 days. 01/22/18 02/05/18  Aviva Kluver B, PA-C      Allergies    Morphine and Turmeric     Review of Systems   Review of Systems  Musculoskeletal:  Positive for arthralgias.  All other systems reviewed and are negative.   Physical Exam Updated Vital Signs BP 120/82   Pulse 94   Temp 98.8 F (37.1 C)   Resp 16   Ht 5\' 2"  (1.575 m)   Wt 75.8 kg   SpO2 99%   BMI 30.54 kg/m  Physical Exam Vitals and nursing note reviewed.  Constitutional:      General: She is not in acute distress.    Appearance: Normal appearance. She is not ill-appearing, toxic-appearing or diaphoretic.  HENT:     Head: Normocephalic and atraumatic.     Nose: Nose normal. No congestion.     Mouth/Throat:     Mouth: Mucous membranes are moist.     Pharynx: Oropharynx is clear.  Eyes:     Extraocular Movements: Extraocular movements intact.     Conjunctiva/sclera: Conjunctivae normal.     Pupils: Pupils are equal, round, and reactive to light.  Cardiovascular:     Rate and Rhythm: Normal rate and regular rhythm.  Pulmonary:     Effort: Pulmonary effort is normal.     Breath sounds: Normal breath sounds. No wheezing.  Abdominal:     General: Abdomen is flat. Bowel sounds are normal.     Palpations: Abdomen is  soft.     Tenderness: There is no abdominal tenderness.  Musculoskeletal:     Cervical back: Normal range of motion and neck supple. No tenderness.     Comments: Patient left ankle has appropriate range of motion actively.  The patient dorsum of her left foot shows no deformity, overlying skin change.  There is minimal tenderness to palpation of the dorsum of the patient left foot.  The patient left knee has appropriate range of motion, the patient is able to ambulate on her left leg without issue.  Patient does report pain inferior to her patella/tibial plateau however no overlying skin change or obvious deformity.  Skin:    General: Skin is warm and dry.     Capillary Refill: Capillary refill takes less than 2 seconds.  Neurological:     Mental Status: She is alert and oriented to  person, place, and time.     ED Results / Procedures / Treatments   Labs (all labs ordered are listed, but only abnormal results are displayed) Labs Reviewed - No data to display  EKG None  Radiology DG Foot Complete Left  Result Date: 04/08/2022 CLINICAL DATA:  Fall at work.  Foot pain. EXAM: LEFT FOOT - COMPLETE 3 VIEW COMPARISON:  None Available. FINDINGS: There is no evidence of fracture or dislocation. There is no evidence of arthropathy or other focal bone abnormality. Soft tissues are unremarkable. IMPRESSION: Negative left foot radiographs. Electronically Signed   By: San Morelle M.D.   On: 04/08/2022 10:21   DG Knee Complete 4 Views Left  Result Date: 04/08/2022 CLINICAL DATA:  Fall on 1 floor.  Knee pain. EXAM: LEFT KNEE - COMPLETE 4 VIEW COMPARISON:  None Available. FINDINGS: No evidence of fracture, dislocation, or joint effusion. No evidence of arthropathy or other focal bone abnormality. Soft tissues are unremarkable. IMPRESSION: Negative left knee radiographs. Electronically Signed   By: San Morelle M.D.   On: 04/08/2022 10:20   DG Ankle Complete Left  Result Date: 04/08/2022 CLINICAL DATA:  Fall on wet floor.  Pain EXAM: LEFT ANKLE COMPLETE - 3 VIEW COMPARISON:  None Available. FINDINGS: There is no evidence of fracture, dislocation, or joint effusion. There is no evidence of arthropathy or other focal bone abnormality. Soft tissues are unremarkable. IMPRESSION: Negative. Electronically Signed   By: San Morelle M.D.   On: 04/08/2022 10:19    Procedures Procedures   Medications Ordered in ED Medications  oxyCODONE-acetaminophen (PERCOCET/ROXICET) 5-325 MG per tablet 1 tablet (1 tablet Oral Given 04/08/22 0930)    ED Course/ Medical Decision Making/ A&P                           Medical Decision Making Amount and/or Complexity of Data Reviewed Radiology: ordered.  Risk Prescription drug management.   31 year old female presents  to the ED for evaluation of left-sided knee pain, ankle pain, foot pain.  Please see HPI for further details.  On examination there is no overlying skin change to the patient left knee.  Patient left knee has appropriate range of motion.  The patient is able to ambulate on her left leg without difficulty.  The patient does report pain to her inferior patella area.  Patient also has appropriate range of motion to her left ankle without overlying skin change or deformity.  The patient dorsum of left foot has minimal tenderness to palpation.  The patient is nontoxic in appearance.  Patient imaging shows no  acute fractures or dislocations.  The patient will be referred to orthopedics and placed in a knee brace in the interim.  Patient is requesting work note which has been provided for 2 days.  Patient was advised to treat pain at home utilizing ibuprofen or Tylenol.  Patient also encouraged to utilize RICE protocol.  Patient voiced understanding of instructions.  Patient had return precautions provided, understanding was voiced.  Patient stable at this time for discharge home.  Final Clinical Impression(s) / ED Diagnoses Final diagnoses:  Acute pain of left knee  Acute left ankle pain  Left foot pain  Fall, initial encounter    Rx / DC Orders ED Discharge Orders     None         Clent Ridges 04/08/22 1129    Jacalyn Lefevre, MD 04/08/22 1345

## 2022-04-08 NOTE — ED Notes (Signed)
Discharge instructions reviewed with patient. Patient requesting pain prescription stronger than advil. PA notified and states will come speak to patient.

## 2022-04-08 NOTE — Discharge Instructions (Addendum)
Please return to the ED with any new concerns or symptoms Please treat pain at home utilizing Tylenol, ibuprofen.  Please also utilize RICE protocol.  Rest, ice, compress, elevate. Please read attached guides concerning ankle pain and knee pain Please follow-up with orthopedics in the next 1 week if your symptoms fail to improve Please utilize provided knee brace while ambulating

## 2022-04-08 NOTE — ED Triage Notes (Signed)
Reports was at work and slipped on wet spot landing on left knee and twisting left foot.

## 2022-04-16 ENCOUNTER — Emergency Department (HOSPITAL_COMMUNITY)
Admission: EM | Admit: 2022-04-16 | Discharge: 2022-04-17 | Discharge status: Against Medical Advice | Payer: BC Managed Care – PPO | Attending: Emergency Medicine | Admitting: Emergency Medicine

## 2022-04-16 ENCOUNTER — Other Ambulatory Visit: Payer: Self-pay

## 2022-04-16 ENCOUNTER — Emergency Department (HOSPITAL_COMMUNITY): Payer: BC Managed Care – PPO

## 2022-04-16 ENCOUNTER — Encounter (HOSPITAL_COMMUNITY): Payer: Self-pay

## 2022-04-16 DIAGNOSIS — R1110 Vomiting, unspecified: Secondary | ICD-10-CM | POA: Insufficient documentation

## 2022-04-16 DIAGNOSIS — Z5321 Procedure and treatment not carried out due to patient leaving prior to being seen by health care provider: Secondary | ICD-10-CM | POA: Insufficient documentation

## 2022-04-16 DIAGNOSIS — M791 Myalgia, unspecified site: Secondary | ICD-10-CM | POA: Diagnosis not present

## 2022-04-16 HISTORY — DX: Bipolar disorder, unspecified: F31.9

## 2022-04-16 LAB — COMPREHENSIVE METABOLIC PANEL
ALT: 21 U/L (ref 0–44)
AST: 22 U/L (ref 15–41)
Albumin: 4.4 g/dL (ref 3.5–5.0)
Alkaline Phosphatase: 81 U/L (ref 38–126)
Anion gap: 10 (ref 5–15)
BUN: 6 mg/dL (ref 6–20)
CO2: 23 mmol/L (ref 22–32)
Calcium: 9.5 mg/dL (ref 8.9–10.3)
Chloride: 105 mmol/L (ref 98–111)
Creatinine, Ser: 0.48 mg/dL (ref 0.44–1.00)
GFR, Estimated: >60 mL/min (ref >60)
Glucose, Bld: 119 mg/dL — ABNORMAL HIGH (ref 70–99)
Potassium: 3.8 mmol/L (ref 3.5–5.1)
Sodium: 138 mmol/L (ref 135–145)
Total Bilirubin: 0.5 mg/dL (ref 0.3–1.2)
Total Protein: 8.1 g/dL (ref 6.5–8.1)

## 2022-04-16 LAB — URINALYSIS, ROUTINE W REFLEX MICROSCOPIC
Bilirubin Urine: NEGATIVE
Glucose, UA: NEGATIVE mg/dL
Hgb urine dipstick: NEGATIVE
Ketones, ur: NEGATIVE mg/dL
Leukocytes,Ua: NEGATIVE
Nitrite: NEGATIVE
Protein, ur: NEGATIVE mg/dL
Specific Gravity, Urine: 1.013 (ref 1.005–1.030)
pH: 5 (ref 5.0–8.0)

## 2022-04-16 LAB — CBC
HCT: 46.2 % — ABNORMAL HIGH (ref 36.0–46.0)
Hemoglobin: 15.2 g/dL — ABNORMAL HIGH (ref 12.0–15.0)
MCH: 27.9 pg (ref 26.0–34.0)
MCHC: 32.9 g/dL (ref 30.0–36.0)
MCV: 84.8 fL (ref 80.0–100.0)
Platelets: 237 10*3/uL (ref 150–400)
RBC: 5.45 MIL/uL — ABNORMAL HIGH (ref 3.87–5.11)
RDW: 12.6 % (ref 11.5–15.5)
WBC: 8.6 10*3/uL (ref 4.0–10.5)
nRBC: 0 % (ref 0.0–0.2)

## 2022-04-16 LAB — PREGNANCY, URINE: Preg Test, Ur: NEGATIVE

## 2022-04-16 LAB — LIPASE, BLOOD: Lipase: 37 U/L (ref 11–51)

## 2022-04-16 MED ORDER — ONDANSETRON 8 MG PO TBDP: 8.0000 mg | ORAL_TABLET | Freq: Once | ORAL | Status: DC

## 2022-04-16 NOTE — ED Triage Notes (Signed)
Patient said for 4 days she has been vomiting. Unable food and drink down. Whole body is aching.

## 2022-04-16 NOTE — ED Provider Triage Note (Signed)
Emergency Medicine Provider Triage Evaluation Note  Mekisha Bittel , a 31 y.o. female  was evaluated in triage.  Pt complains of emesis and body aches for 4 days. No cough. Reports that if feels like someone is sitting on her chest.  Review of Systems  Positive: Chest tightness, muscle aches Negative: fever  Physical Exam  BP (!) 127/95 (BP Location: Left Arm)   Pulse (!) 110   Temp 98.3 F (36.8 C) (Oral)   Resp 17   SpO2 98%  Gen:   Awake, no distress   Resp:  Normal effort. Chest clear to auscultation MSK:   Moves extremities without difficulty  Other:  Epigastric discomfort. Abdomen soft.  Medical Decision Making  Medically screening exam initiated at 6:49 PM.  Appropriate orders placed.  Khadeja Abt was informed that the remainder of the evaluation will be completed by another provider, this initial triage assessment does not replace that evaluation, and the importance of remaining in the ED until their evaluation is complete.     Etta Quill, NP 04/16/22 [phone]
